# Patient Record
Sex: Male | Born: 1995 | Race: Black or African American | Hispanic: No | Marital: Single | Smoking: Never smoker
Health system: Southern US, Community
[De-identification: ages and names within clinical notes are randomized; demographics above are authoritative.]

## PROBLEM LIST (undated history)

## (undated) DIAGNOSIS — J45909 Unspecified asthma, uncomplicated: Secondary | ICD-10-CM

## (undated) HISTORY — PX: NO PAST SURGERIES: SHX2092

---
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
## 2018-01-08 ENCOUNTER — Other Ambulatory Visit: Payer: Self-pay

## 2018-01-08 ENCOUNTER — Ambulatory Visit
Admission: EM | Admit: 2018-01-08 | Discharge: 2018-01-08 | Disposition: A | Discharge status: Home / Self Care | Payer: Managed Care, Other (non HMO) | Attending: Physician Assistant | Admitting: Physician Assistant

## 2018-01-08 DIAGNOSIS — J029 Acute pharyngitis, unspecified: Secondary | ICD-10-CM

## 2018-01-08 DIAGNOSIS — Z113 Encounter for screening for infections with a predominantly sexual mode of transmission: Secondary | ICD-10-CM | POA: Diagnosis not present

## 2018-01-08 DIAGNOSIS — R590 Localized enlarged lymph nodes: Secondary | ICD-10-CM | POA: Diagnosis not present

## 2018-01-08 LAB — RAPID STREP SCREEN (MED CTR MEBANE ONLY): Streptococcus, Group A Screen (Direct): NEGATIVE

## 2018-01-08 LAB — MONONUCLEOSIS SCREEN: MONO SCREEN: NEGATIVE

## 2018-01-08 LAB — CHLAMYDIA/NGC RT PCR (ARMC ONLY)
CHLAMYDIA TR: NOT DETECTED
N GONORRHOEAE: NOT DETECTED

## 2018-01-08 NOTE — ED Triage Notes (Signed)
Patient c/o sore throat, HA, abdominal pain and nausea x 2 days. Patient states he noticed his left lymph nodes in his neck were swollen two weeks ago and now the right side is swollen.

## 2018-01-08 NOTE — ED Provider Notes (Signed)
[facility]    CSN: [id number] Arrival date & time: 01/08/18  1415     History   Chief Complaint Chief Complaint  Patient presents with  . Sore Throat    HPI Scott Rose is a 22 y.o. male.   Patient is a 22 year old male who presents with complaint of sore throat and bilateral lymph node pain.  Patient states his left lymph node is been sore for 1-1/2-2 weeks in his right lymph node started hurting about 2 days ago.  Patient also reports upset stomach 2 days ago.  Reports little bit of a cough and occasional chills.  Patient denies any ear symptoms, no fever, no body aches.  Patient does report his little brother at home is sick off and on and has had a school made that has been sick recently with pneumonia.  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  Patient states he is taking medication for headache.  Patient also requesting STD checks.  States he has a Engineer, materialselectronic cigarette that he likes other share and wants to make sure he does not have any transmittal diseases from that.      History reviewed. No pertinent past medical history.  There are no active problems to display for this patient.   History reviewed. No pertinent surgical history.     Home Medications    Prior to Admission medications   Not on File    Family History History reviewed. No pertinent family history.  Social History Social History   Tobacco Use  . Smoking status: Never Smoker  . Smokeless tobacco: Never Used  Substance Use Topics  . Alcohol use: No    Frequency: Never  . Drug use: No     Allergies   Patient has no known allergies.   Review of Systems Review of Systems  As noted above in HPI.  Other systems reviewed and found to be negative   Physical Exam Triage Vital Signs ED Triage Vitals  Enc Vitals Group     BP 01/08/18 1437 125/67     Pulse Rate 01/08/18 1437 68     Resp --      Temp 01/08/18 1437 98.1 F (36.7 C)     Temp Source 01/08/18  1437 Oral     SpO2 01/08/18 1437 100 %     Weight 01/08/18 1436 165 lb (74.8 kg)     Height 01/08/18 1436 5\' 11"  (1.803 m)     Head Circumference --      Peak Flow --      Pain Score 01/08/18 1436 6     Pain Loc --      Pain Edu? --      Excl. in GC? --    No data found.  Updated Vital Signs BP 125/67 (BP Location: Left Arm)   Pulse 68   Temp 98.1 F (36.7 C) (Oral)   Ht 5\' 11"  (1.803 m)   Wt 165 lb (74.8 kg)   SpO2 100%   BMI 23.01 kg/m    Physical Exam  Constitutional: He is oriented to person, place, and time. He appears well-developed and well-nourished. He does not appear ill.  HENT:  Head: Normocephalic and atraumatic.  Right Ear: Tympanic membrane and ear canal normal.  Left Ear: Tympanic membrane and ear canal normal.  Mouth/Throat: Uvula is midline and mucous membranes are normal. Posterior oropharyngeal erythema (with splotchy strawberry colorations) present. Tonsils are 1+ on the right. Tonsils are 1+ on the  left. No tonsillar exudate.  Eyes: Pupils are equal, round, and reactive to light.  Neck: Normal range of motion. Neck supple.  Cardiovascular: Normal rate, regular rhythm and normal heart sounds.  No murmur heard. Pulmonary/Chest: Effort normal and breath sounds normal. No respiratory distress. He has no wheezes.  Abdominal: Soft. Bowel sounds are normal. He exhibits no distension. There is no tenderness. There is no guarding.  Lymphadenopathy:    He has cervical adenopathy (bilateral).  Neurological: He is alert and oriented to person, place, and time.  Skin: Skin is warm and dry.  Psychiatric: He has a normal mood and affect. His behavior is normal.     UC Treatments / Results  Labs (all labs ordered are listed, but only abnormal results are displayed) Labs Reviewed  RAPID STREP SCREEN (NOT AT [facility])  CHLAMYDIA/NGC RT PCR ([facility] ONLY)  CULTURE, GROUP A STREP [facility])  MONONUCLEOSIS SCREEN  HIV ANTIBODY (ROUTINE TESTING)  HSV(HERPES  SIMPLEX VRS) I + II AB-IGG  RPR    EKG  EKG Interpretation None       Radiology No results found.  Procedures Procedures (including critical care time)  Medications Ordered in UC Medications - No data to display   Initial Impression / Assessment and Plan / UC Course  I have reviewed the triage vital signs and the nursing notes.  Pertinent labs & imaging results that were available during my care of the patient were reviewed by me and considered in my medical decision making (see chart for details).    Patient was complaining of sore throat as well as lymph node swelling.  Patient reports left-sided lymph node swelling times about 1/2-2 weeks in the right node being swollen for about 2 days.  Patient also with complaint of sore throat.  Patient with splotchy strawberry coloring to the throat so we will get a swab.  Patient also requesting STD check.  EBV would be a differential for the cervical adenopathy given its duration.  Also patient reported sharing his e-cigarette with others makes EBV a more possible cause for his lymphadenopathy.    Final Clinical Impressions(s) / UC Diagnoses   Final diagnoses:  Sore throat  Cervical adenopathy  Screen for STD (sexually transmitted disease)    ED Discharge Orders    None     Patient rapid strep negative.  We will give him precautions regarding sharing his E cigarettes in the current flu climate.  Advised him to treat his symptoms as needed due to likely viral infection.  Recommend ibuprofen or Tylenol as needed for pain and fever.  Have patient follow-up with his primary care provider.  Will contact him as the results of his tests come back.  Controlled Substance Prescriptions  Controlled Substance Registry consulted? Not Applicable   Candis Schatz, PA-C 01/08/18 1610

## 2018-01-08 NOTE — Discharge Instructions (Signed)
-  Strep test negative -can use ibuprofen or Tylenol for pain -sore throat and lymph nodes likely related to a viral infection which is treated with symptom management and fluids -The influenza virus is very prevalent, use caution in sharing e-cigarette with others -will contact you regarding your other tests -follow up with your PCP as needed

## 2018-01-11 LAB — CULTURE, GROUP A STREP (THRC)

## 2018-01-11 LAB — HIV ANTIBODY (ROUTINE TESTING W REFLEX): HIV SCREEN 4TH GENERATION: NONREACTIVE

## 2018-01-11 LAB — RPR: RPR Ser Ql: NONREACTIVE

## 2018-01-11 LAB — HSV(HERPES SIMPLEX VRS) I + II AB-IGG
HSV 1 Glycoprotein G Ab, IgG: 32.3 index — ABNORMAL HIGH (ref 0.00–0.90)
HSV 2 Glycoprotein G Ab, IgG: <0.91 index (ref 0.00–0.90)

## 2018-05-26 ENCOUNTER — Other Ambulatory Visit: Payer: Self-pay

## 2018-05-26 ENCOUNTER — Ambulatory Visit
Admission: EM | Admit: 2018-05-26 | Discharge: 2018-05-26 | Disposition: A | Discharge status: Home / Self Care | Payer: Managed Care, Other (non HMO) | Attending: Family Medicine | Admitting: Family Medicine

## 2018-05-26 ENCOUNTER — Encounter: Payer: Self-pay | Admitting: Emergency Medicine

## 2018-05-26 DIAGNOSIS — Z113 Encounter for screening for infections with a predominantly sexual mode of transmission: Secondary | ICD-10-CM

## 2018-05-26 DIAGNOSIS — Z202 Contact with and (suspected) exposure to infections with a predominantly sexual mode of transmission: Secondary | ICD-10-CM

## 2018-05-26 LAB — CHLAMYDIA/NGC RT PCR (ARMC ONLY)
Chlamydia Tr: NOT DETECTED
N gonorrhoeae: NOT DETECTED

## 2018-05-26 MED ORDER — TINIDAZOLE 500 MG PO TABS: 2.0000 g | ORAL_TABLET | Freq: Once | ORAL | 0 refills | Status: AC

## 2018-05-26 NOTE — ED Triage Notes (Signed)
Patient in today stating that his ex-partner told him that they were positive for trichomonas. He is here today to be tested. He denies any symptoms such as penile discharge or pain.

## 2018-05-26 NOTE — ED Provider Notes (Signed)
[facility]    CSN: [id number] Arrival date & time: 05/26/18  1800   History   Chief Complaint Chief Complaint  Patient presents with  . Exposure to STD    HPI  22 year old male presents with exposure to trichomonas.  Patient states that he was recently informed that his former partner tested positive for trichomonas.  He desires testing and treatment today.  He states that he is currently symptomatic.  No penile discharge.  No dysuria.  Patient is feeling well.  He has no other complaints or concerns at this time.  Social History Social History   Tobacco Use  . Smoking status: Never Smoker  . Smokeless tobacco: Never Used  Substance Use Topics  . Alcohol use: No    Frequency: Never  . Drug use: No    Allergies   Patient has no known allergies.   Review of Systems Review of Systems  Constitutional: Negative.   Genitourinary:       STD exposure.   Physical Exam Triage Vital Signs ED Triage Vitals  Enc Vitals Group     BP 05/26/18 1812 140/80     Pulse Rate 05/26/18 1812 73     Resp 05/26/18 1812 16     Temp 05/26/18 1812 98.5 F (36.9 C)     Temp Source 05/26/18 1812 Oral     SpO2 05/26/18 1812 100 %     Weight 05/26/18 1811 178 lb 12.8 oz (81.1 kg)     Height 05/26/18 1811 5\' 10"  (1.778 m)     Head Circumference --      Peak Flow --      Pain Score 05/26/18 1811 0     Pain Loc --      Pain Edu? --    Updated Vital Signs BP 140/80 (BP Location: Left Arm)   Pulse 73   Temp 98.5 F (36.9 C) (Oral)   Resp 16   Ht 5\' 10"  (1.778 m)   Wt 178 lb 12.8 oz (81.1 kg)   SpO2 100%   BMI 25.66 kg/m    Physical Exam  Constitutional: He is oriented to person, place, and time. He appears well-developed. No distress.  HENT:  Head: Normocephalic and atraumatic.  Eyes: Conjunctivae are normal.  Cardiovascular: Normal rate and regular rhythm.  Pulmonary/Chest: Effort normal and breath sounds normal.  Neurological: He is alert and oriented to  person, place, and time.  Psychiatric: He has a normal mood and affect. His behavior is normal.  Nursing note and vitals reviewed.   UC Treatments / Results  Labs (all labs ordered are listed, but only abnormal results are displayed) Labs Reviewed  CHLAMYDIA/NGC RT PCR ([facility] ONLY)  MISC [facility] TEST (SEND OUT)    EKG None  Radiology No results found.  Procedures Procedures (including critical care time)  Medications Ordered in UC Medications - No data to display  Initial Impression / Assessment and Plan / UC Course  I have reviewed the triage vital signs and the nursing notes.  Pertinent labs & imaging results that were available during my care of the patient were reviewed by me and considered in my medical decision making (see chart for details).    22 year old male presents with exposure to trichomonas.  Sending GC and chlamydia.  Sending test for trichomonas.  Treating empirically with tinidazole.   Final Clinical Impressions(s) / UC Diagnoses   Final diagnoses:  STD exposure     Discharge Instructions     Medication  as prescribed.  We will call with the results.  Take care  Dr. Adriana Simas     ED Prescriptions    Medication Sig Dispense Auth. Provider   tinidazole (TINDAMAX) 500 MG tablet Take 4 tablets (2,000 mg total) by mouth once for 1 dose. 4 tablet Tommie Sams, DO     Controlled Substance Prescriptions [place] Controlled Substance Registry consulted? Not Applicable   Tommie Sams, DO 05/26/18 Paulo Fruit

## 2018-05-26 NOTE — Discharge Instructions (Signed)
Medication as prescribed.  We will call with the results.  Take care  Dr. Kaityln Kallstrom  

## 2018-05-30 LAB — MISC LABCORP TEST (SEND OUT): LABCORP TEST CODE: 188052

## 2018-06-22 ENCOUNTER — Ambulatory Visit
Admission: EM | Admit: 2018-06-22 | Discharge: 2018-06-22 | Disposition: A | Discharge status: Home / Self Care | Payer: Managed Care, Other (non HMO) | Attending: Emergency Medicine | Admitting: Emergency Medicine

## 2018-06-22 ENCOUNTER — Other Ambulatory Visit: Payer: Self-pay

## 2018-06-22 DIAGNOSIS — Z113 Encounter for screening for infections with a predominantly sexual mode of transmission: Secondary | ICD-10-CM | POA: Diagnosis not present

## 2018-06-22 DIAGNOSIS — Z202 Contact with and (suspected) exposure to infections with a predominantly sexual mode of transmission: Secondary | ICD-10-CM | POA: Diagnosis not present

## 2018-06-22 LAB — CHLAMYDIA/NGC RT PCR (ARMC ONLY)
CHLAMYDIA TR: NOT DETECTED
N gonorrhoeae: NOT DETECTED

## 2018-06-22 MED ORDER — METRONIDAZOLE 500 MG PO TABS: 2000.0000 mg | ORAL_TABLET | Freq: Once | ORAL | 0 refills | Status: AC

## 2018-06-22 NOTE — ED Triage Notes (Signed)
Pt reports he was here on 05/26/18 and had STD testing. His results came back negative. He was prescribed medication but didn't pick any up. He wasn't sure if he was to start on meds. No sx but wondering if he should be taking meds and if he should get a new prescription.

## 2018-06-22 NOTE — Discharge Instructions (Addendum)
We are sending off repeat labs for gonorrhea, chlamydia, trichomonas.  I am sending you home with a wait-and-see prescription of Flagyl for you to fill.  Call here in several days to get your test results.  Fill the Flagyl if your trichomonas comes back positive and take all 4 pills at once.  This should treat it.  We will also treat you appropriately if your gonorrhea and/or chlamydia come back positive.  Your partner will require testing and treatment as well your labs come back positive.  Here is a list of primary care providers who are taking new patients:  Dr. Elizabeth Sauereanna Jones, Dr. Schuyler AmorWilliam Plonk [address] [place] [address] [phone]  [facility] [place] [address]  [place] [address]  [phone]  [facility] [address]  [place], [place] [address] [phone]  [facility] [place] [address]  [phone] [place], [place] [address]  Here are clinics/ other resources who will see you if you do not have insurance. Some have certain criteria that you must meet. Call them and find out what they are:  [facility]: [address] [place] [address] [place], [place] [address] Phone: [phone] Hours: First and Third Saturdays of each Month, 9 a.m. - 1 p.m.  [facility]: [address] [place], [place], [place] [address] Phone: [phone] Hours: Tuesday, 4 p.m. - 8 p.m. Thursday, 1 p.m. - 8 p.m. Wednesday, 9 a.m. - [facility]  [facility] [address], [place], [place] [address] Phone: [phone] Pharmacy Phone Number: [phone] Dental Phone Number: [phone] [facility] Insurance Help: [phone]  Dental Hours: Monday - Thursday, 8 a.m. - 6 p.m.  [facility] [address] [place], [place] [address] Phone: [phone] Pharmacy Phone Number: [phone] [facility] Insurance Help: [phone]  [facility] [address] [place], [place] [address] Phone:  [phone] Pharmacy Phone Number: [phone] [facility] Insurance Help: [phone]  [facility] [address] [place], [place] [address] Phone: [phone] [facility] Insurance Help: [phone]   [facility] [address] [place], [place] [address] Phone: [phone]  Go to www.goodrx.com to look up your medications. This will give you a list of where you can find your prescriptions at the most affordable prices. Or ask the pharmacist what the cash price is, or if they have any other discount programs available to help make your medication more affordable. This can be less expensive than what you would pay with insurance.

## 2018-06-22 NOTE — ED Provider Notes (Signed)
HPI  SUBJECTIVE:  Scott Rose is a 22 y.o. male who presents for STD testing.  Patient states that his current partner was diagnosed with trichomoniasis last month.  He is not sure if she was tested for other STDs.  He states that she was "treated for a bunch of things" but is not sure exactly what she was treated for. Patient was seen here last month for trichomonas exposure from his current sexual partner.  Rx'd tinidazole, which he never filled.   Gonorrhea, chlamydia, trichomonas sent and were negative.  he is asymptomatic today.  Denies urinary complaints, penile rash, penile discharge, testicular pain.  He states that he has not been with anybody else since his last visit. However, he is concerned and would like to have repeat testing for gonorrhea, chlamydia, trichomonas.   History reviewed. No pertinent past medical history.  History reviewed. No pertinent surgical history.  Family History  Problem Relation Age of Onset  . Healthy Mother   . Healthy Father     Social History   Tobacco Use  . Smoking status: Never Smoker  . Smokeless tobacco: Never Used  Substance Use Topics  . Alcohol use: Yes    Frequency: Never    Comment: social  . Drug use: No    No current facility-administered medications for this encounter.   Current Outpatient Medications:  .  metroNIDAZOLE (FLAGYL) 500 MG tablet, Take 4 tablets (2,000 mg total) by mouth once for 1 dose., Disp: 4 tablet, Rfl: 0  No Known Allergies   ROS  As noted in HPI.   Physical Exam  BP 129/71 (BP Location: Left Arm)   Pulse 64   Temp 98.3 F (36.8 C) (Oral)   Resp 18   Ht 5\' 10"  (1.778 m)   Wt 178 lb (80.7 kg)   SpO2 100%   BMI 25.54 kg/m   Constitutional: Well developed, well nourished, no acute distress Eyes:  EOMI, conjunctiva normal bilaterally HENT: Normocephalic, atraumatic,mucus membranes moist Respiratory: Normal inspiratory effort Cardiovascular: Normal rate GI: nondistended skin: No rash,  skin intact Musculoskeletal: no deformities Neurologic: Alert & oriented x 3, no focal neuro deficits Psychiatric: Speech and behavior appropriate   ED Course   Medications - No data to display  Orders Placed This Encounter  Procedures  . Chlamydia/NGC rt PCR    Standing Status:   Standing    Number of Occurrences:   1    Order Specific Question:   Patient immune status    Answer:   Normal  . Miscellaneous [facility] test (send-out)    Standing Status:   Standing    Number of Occurrences:   1    Order Specific Question:   Test name / description:    Answer:   male trichamonas NAAT    No results found for this or any previous visit (from the past 24 hour(s)). No results found.  ED Clinical Impression  Screening for STD (sexually transmitted disease)   ED Assessment/Plan  Previous labs, records reviewed as noted in HPI.  Patient declined testing for HIV and syphilis.  Will send off gonorrhea, chlamydia, trichomonas.  We will give him a wait-and-see prescription of Flagyl 2 g p.o. x1 if his trichomonas comes back positive.  Advised him to call here in several days to get his test results.  He is to fill the Flagyl if his trichomonas comes back positive.  We will treat accordingly depending on his other labs.  Advised him that his partner  will also need testing and treatment if any of his labs come back positive.  He will follow-up with a primary care physician of his choice, giving primary care referral list.  Discussed labs,  MDM, treatment plan, and plan for follow-up with patient.patient agrees with plan.   Meds ordered this encounter  Medications  . metroNIDAZOLE (FLAGYL) 500 MG tablet    Sig: Take 4 tablets (2,000 mg total) by mouth once for 1 dose.    Dispense:  4 tablet    Refill:  0    *This clinic note was created using Scientist, clinical (histocompatibility and immunogenetics). Therefore, there may be occasional mistakes despite careful proofreading.   ?   Domenick Gong, MD 06/22/18  2017

## 2018-06-24 LAB — MISC LABCORP TEST (SEND OUT): Labcorp test code: 188052

## 2018-08-10 ENCOUNTER — Other Ambulatory Visit: Payer: Self-pay

## 2018-08-10 ENCOUNTER — Encounter: Payer: Self-pay | Admitting: Emergency Medicine

## 2018-08-10 ENCOUNTER — Ambulatory Visit
Admission: EM | Admit: 2018-08-10 | Discharge: 2018-08-10 | Disposition: A | Discharge status: Home / Self Care | Payer: Managed Care, Other (non HMO) | Attending: Internal Medicine | Admitting: Internal Medicine

## 2018-08-10 DIAGNOSIS — J020 Streptococcal pharyngitis: Secondary | ICD-10-CM | POA: Diagnosis not present

## 2018-08-10 HISTORY — DX: Unspecified asthma, uncomplicated: J45.909

## 2018-08-10 LAB — RAPID STREP SCREEN (MED CTR MEBANE ONLY): Streptococcus, Group A Screen (Direct): POSITIVE — AB

## 2018-08-10 MED ORDER — PENICILLIN V POTASSIUM 500 MG PO TABS: 500.0000 mg | ORAL_TABLET | Freq: Two times a day (BID) | ORAL | 0 refills | Status: AC

## 2018-08-10 NOTE — Discharge Instructions (Signed)
Make sure to finish all the antibiotic. OK to take Dayquil for your cold

## 2018-08-10 NOTE — ED Provider Notes (Signed)
[facility]    CSN: [id number] Arrival date & time: 08/10/18  1629     History   Chief Complaint ST  HPI Scott Rose is a 22 y.o. male.   ST 2 days ago and gradually got worse and got a little nauseous. Today the ST is not  as bad. Has also been having URI x 3 days. Yesterday had chills. No body aches. Has a HA yesterday. No Gi symptoms.      Past Medical History:  Diagnosis Date  . Asthma     There are no active problems to display for this patient.   History reviewed. No pertinent surgical history.     Home Medications    Prior to Admission medications   Medication Sig Start Date End Date Taking? Authorizing Provider  albuterol (PROVENTIL HFA;VENTOLIN HFA) 108 (90 Base) MCG/ACT inhaler Inhale into the lungs every 6 (six) hours as needed for wheezing or shortness of breath.   Yes [provider]  penicillin v potassium (VEETID) 500 MG tablet Take 1 tablet (500 mg total) by mouth 2 (two) times daily with breakfast and lunch for 10 days. 08/10/18 08/20/18  Rodriguez-Southworth, Nettie Elm, PA-C    Family History Family History  Problem Relation Age of Onset  . Healthy Mother   . Healthy Father     Social History Social History   Tobacco Use  . Smoking status: Never Smoker  . Smokeless tobacco: Never Used  Substance Use Topics  . Alcohol use: Yes    Frequency: Never    Comment: social  . Drug use: No     Allergies   Patient has no known allergies.   Review of Systems Review of Systems  Constitutional: Positive for chills.  HENT: Positive for mouth sores, postnasal drip, rhinorrhea, sore throat and trouble swallowing. Negative for ear pain.   Respiratory: Positive for cough. Negative for chest tightness and shortness of breath.   Cardiovascular: Negative for chest pain.  Gastrointestinal: Negative for diarrhea, nausea and vomiting.  Musculoskeletal: Negative for arthralgias and joint swelling.  Skin: Negative for rash.    Allergic/Immunologic: Negative for immunocompromised state.       Has not noticed swollen glands  Neurological: Negative for dizziness.     Physical Exam Triage Vital Signs ED Triage Vitals [08/10/18 1640]  Enc Vitals Group     BP 140/71     Pulse Rate 73     Resp 18     Temp 98.2 F (36.8 C)     Temp Source Oral     SpO2 99 %     Weight 175 lb (79.4 kg)     Height 5\' 10"  (1.778 m)     Head Circumference      Peak Flow      Pain Score 3     Pain Loc      Pain Edu?      Excl. in GC?    No data found.  Updated Vital Signs BP 140/71 (BP Location: Left Arm)   Pulse 73   Temp 98.2 F (36.8 C) (Oral)   Resp 18   Ht 5\' 10"  (1.778 m)   Wt 175 lb (79.4 kg)   SpO2 99%   BMI 25.11 kg/m   Visual Acuity Right Eye Distance:   Left Eye Distance:   Bilateral Distance:    Right Eye Near:   Left Eye Near:    Bilateral Near:     Physical Exam   UC Treatments /  Results  Labs (all labs ordered are listed, but only abnormal results are displayed) Labs Reviewed  RAPID STREP SCREEN ([facility] ONLY) - Abnormal; Notable for the following components:      Result Value   Streptococcus, Group A Screen (Direct) POSITIVE (*)    All other components within normal limits  + rapid strep  EKG None  Radiology No results found.  Procedures none  Medications Ordered in UC Medications - No data to display  Initial Impression / Assessment and Plan / UC Course  I have reviewed the triage vital signs and the nursing notes.  Pertinent labs & imaging results that were available during my care of the patient were reviewed by me and considered in my medical decision making (see chart for details). He was placed on Penicillin 500 mg bid x 10 days and encourage to finish all of it.  Not contagious via air, but is via saliva.  Work note given to him. He checked with his boss about going back to work tomorrow and boss was OK with that.    Final Clinical Impressions(s) / UC  Diagnoses   Final diagnoses:  None     Discharge Instructions     Make sure to finish all the antibiotic. OK to take Dayquil for your cold    ED Prescriptions    Medication Sig Dispense Auth. Provider   penicillin v potassium (VEETID) 500 MG tablet Take 1 tablet (500 mg total) by mouth 2 (two) times daily with breakfast and lunch for 10 days. 40 tablet Rodriguez-Southworth, Nettie Elm, PA-C     Controlled Substance Prescriptions [place] Controlled Substance Registry consulted? NA   Garey Ham, [place] 08/10/18 1959

## 2018-08-10 NOTE — ED Triage Notes (Signed)
Patient c/o sore throat, headache, cough and nasal drainage that started on Sunday. Patient denies fever. Patient has taken OTC Theraflu with no relief.

## 2018-10-19 ENCOUNTER — Other Ambulatory Visit: Payer: Self-pay

## 2018-10-19 ENCOUNTER — Encounter: Payer: Self-pay | Admitting: Emergency Medicine

## 2018-10-19 ENCOUNTER — Ambulatory Visit
Admission: EM | Admit: 2018-10-19 | Discharge: 2018-10-19 | Disposition: A | Discharge status: Home / Self Care | Payer: Managed Care, Other (non HMO) | Attending: Family Medicine | Admitting: Family Medicine

## 2018-10-19 DIAGNOSIS — Z113 Encounter for screening for infections with a predominantly sexual mode of transmission: Secondary | ICD-10-CM

## 2018-10-19 LAB — CHLAMYDIA/NGC RT PCR (ARMC ONLY)
CHLAMYDIA TR: NOT DETECTED
N gonorrhoeae: NOT DETECTED

## 2018-10-19 NOTE — ED Provider Notes (Signed)
[facility]    CSN: [id number] Arrival date & time: 10/19/18  1502     History   Chief Complaint Chief Complaint  Patient presents with  . Exposure to STD    HPI Scott Rose is a 22 y.o. male.   22 yo male with a c/o wanting to get checked for STDs, specifically chlamydia, gonorrhea and trichomonas. States he's had chlamydia in the past and had a partner earlier this year who was diagnosed with trichomonas. Patient denies having any symptoms currently. Denies any penile discharge, dysuria, hematuria, rash, fevers, chills. Declines HIV and RPR testing.    The history is provided by the patient.  Exposure to STD     Past Medical History:  Diagnosis Date  . Asthma     There are no active problems to display for this patient.   Past Surgical History:  Procedure Laterality Date  . NO PAST SURGERIES         Home Medications    Prior to Admission medications   Medication Sig Start Date End Date Taking? Authorizing Provider  albuterol (PROVENTIL HFA;VENTOLIN HFA) 108 (90 Base) MCG/ACT inhaler Inhale into the lungs every 6 (six) hours as needed for wheezing or shortness of breath.   Yes [provider]    Family History Family History  Problem Relation Age of Onset  . Healthy Mother   . Healthy Father     Social History Social History   Tobacco Use  . Smoking status: Never Smoker  . Smokeless tobacco: Never Used  Substance Use Topics  . Alcohol use: Yes    Frequency: Never    Comment: social  . Drug use: No     Allergies   Patient has no known allergies.   Review of Systems Review of Systems   Physical Exam Triage Vital Signs ED Triage Vitals  Enc Vitals Group     BP 10/19/18 1513 119/69     Pulse Rate 10/19/18 1513 (!) 58     Resp 10/19/18 1513 16     Temp 10/19/18 1513 98.5 F (36.9 C)     Temp Source 10/19/18 1513 Oral     SpO2 10/19/18 1513 100 %     Weight 10/19/18 1514 172 lb (78 kg)     Height 10/19/18  1514 5\' 9"  (1.753 m)     Head Circumference --      Peak Flow --      Pain Score 10/19/18 1513 0     Pain Loc --      Pain Edu? --      Excl. in GC? --    No data found.  Updated Vital Signs BP 119/69 (BP Location: Left Arm)   Pulse (!) 58   Temp 98.5 F (36.9 C) (Oral)   Resp 16   Ht 5\' 9"  (1.753 m)   Wt 78 kg   SpO2 100%   BMI 25.40 kg/m   Visual Acuity Right Eye Distance:   Left Eye Distance:   Bilateral Distance:    Right Eye Near:   Left Eye Near:    Bilateral Near:     Physical Exam  Constitutional: He appears well-developed and well-nourished. No distress.  Skin: He is not diaphoretic.  Nursing note and vitals reviewed.    UC Treatments / Results  Labs (all labs ordered are listed, but only abnormal results are displayed) Labs Reviewed  CHLAMYDIA/NGC RT PCR ([facility] ONLY)  MISC [facility] TEST (SEND OUT)  EKG None  Radiology No results found.  Procedures Procedures (including critical care time)  Medications Ordered in UC Medications - No data to display  Initial Impression / Assessment and Plan / UC Course  I have reviewed the triage vital signs and the nursing notes.  Pertinent labs & imaging results that were available during my care of the patient were reviewed by me and considered in my medical decision making (see chart for details).      Final Clinical Impressions(s) / UC Diagnoses   Final diagnoses:  Screen for STD (sexually transmitted disease)    ED Prescriptions    None     1. diagnosis reviewed with patient  2. Check labs  3. Further recommendations pending test results  Controlled Substance Prescriptions  Controlled Substance Registry consulted? Not Applicable   Payton Mccallum, MD 10/19/18 [id number]

## 2018-10-19 NOTE — ED Triage Notes (Signed)
Patient in today for STD testing. Patient denies exposure to STD since his last visit and states he hasn't had unprotected intercourse. Patient denies penile discharge or pain with urination.

## 2018-10-21 LAB — MISC LABCORP TEST (SEND OUT): LABCORP TEST CODE: 188052

## 2019-02-13 ENCOUNTER — Encounter: Payer: Self-pay | Admitting: Emergency Medicine

## 2019-02-13 ENCOUNTER — Other Ambulatory Visit: Payer: Self-pay

## 2019-02-13 ENCOUNTER — Ambulatory Visit
Admission: EM | Admit: 2019-02-13 | Discharge: 2019-02-13 | Disposition: A | Discharge status: Home / Self Care | Payer: Managed Care, Other (non HMO) | Attending: Family Medicine | Admitting: Family Medicine

## 2019-02-13 DIAGNOSIS — Z202 Contact with and (suspected) exposure to infections with a predominantly sexual mode of transmission: Secondary | ICD-10-CM | POA: Diagnosis not present

## 2019-02-13 DIAGNOSIS — R369 Urethral discharge, unspecified: Secondary | ICD-10-CM | POA: Diagnosis not present

## 2019-02-13 DIAGNOSIS — Z113 Encounter for screening for infections with a predominantly sexual mode of transmission: Secondary | ICD-10-CM | POA: Diagnosis not present

## 2019-02-13 LAB — URINALYSIS, COMPLETE (UACMP) WITH MICROSCOPIC
Bilirubin Urine: NEGATIVE
Glucose, UA: NEGATIVE mg/dL
Hgb urine dipstick: NEGATIVE
KETONES UR: 15 mg/dL — AB
Nitrite: NEGATIVE
PROTEIN: 30 mg/dL — AB
pH: 6 (ref 5.0–8.0)

## 2019-02-13 LAB — CHLAMYDIA/NGC RT PCR (ARMC ONLY)
CHLAMYDIA TR: NOT DETECTED
N gonorrhoeae: DETECTED — AB

## 2019-02-13 MED ORDER — AZITHROMYCIN 500 MG PO TABS
1000.0000 mg | ORAL_TABLET | Freq: Once | ORAL | Status: AC
  Administered 2019-02-13: 1000 mg via ORAL

## 2019-02-13 MED ORDER — CEFTRIAXONE SODIUM 250 MG IJ SOLR
250.0000 mg | Freq: Once | INTRAMUSCULAR | Status: AC
  Administered 2019-02-13: 250 mg via INTRAMUSCULAR

## 2019-02-13 MED ORDER — METRONIDAZOLE 500 MG PO TABS: ORAL_TABLET | ORAL | 0 refills | Status: AC

## 2019-02-13 NOTE — ED Provider Notes (Signed)
[facility]    CSN: [id number] Arrival date & time: 02/13/19  1718     History   Chief Complaint Chief Complaint  Patient presents with  . Exposure to STD    HPI [place] L Nish is a 23 y.o. male.   23 yo male with a 2-3 day h/o white discharge. States he's had unprotected intercourse and has been exposed to trichomonas in the past. Denies any fevers, chills, pain, swelling.   The history is provided by the patient.  Exposure to STD     Past Medical History:  Diagnosis Date  . Asthma     There are no active problems to display for this patient.   Past Surgical History:  Procedure Laterality Date  . NO PAST SURGERIES         Home Medications    Prior to Admission medications   Medication Sig Start Date End Date Taking? Authorizing Provider  albuterol (PROVENTIL HFA;VENTOLIN HFA) 108 (90 Base) MCG/ACT inhaler Inhale into the lungs every 6 (six) hours as needed for wheezing or shortness of breath.    [provider]  metroNIDAZOLE (FLAGYL) 500 MG tablet 4 tabs po once 02/13/19   Payton Mccallum, MD    Family History Family History  Problem Relation Age of Onset  . Healthy Mother   . Healthy Father     Social History Social History   Tobacco Use  . Smoking status: Never Smoker  . Smokeless tobacco: Never Used  Substance Use Topics  . Alcohol use: Yes    Frequency: Never    Comment: social  . Drug use: Yes    Types: Marijuana     Allergies   Patient has no known allergies.   Review of Systems Review of Systems   Physical Exam Triage Vital Signs ED Triage Vitals  Enc Vitals Group     BP 02/13/19 1742 (!) 143/71     Pulse Rate 02/13/19 1742 61     Resp 02/13/19 1742 18     Temp 02/13/19 1742 98.4 F (36.9 C)     Temp Source 02/13/19 1742 Oral     SpO2 02/13/19 1742 100 %     Weight 02/13/19 1739 180 lb (81.6 kg)     Height 02/13/19 1739 5\' 10"  (1.778 m)     Head Circumference --      Peak Flow --      Pain Score  02/13/19 1739 0     Pain Loc --      Pain Edu? --      Excl. in GC? --    No data found.  Updated Vital Signs BP (!) 143/71 (BP Location: Left Arm)   Pulse 61   Temp 98.4 F (36.9 C) (Oral)   Resp 18   Ht 5\' 10"  (1.778 m)   Wt 81.6 kg   SpO2 100%   BMI 25.83 kg/m   Visual Acuity Right Eye Distance:   Left Eye Distance:   Bilateral Distance:    Right Eye Near:   Left Eye Near:    Bilateral Near:     Physical Exam Vitals signs and nursing note reviewed.  Constitutional:      General: He is not in acute distress.    Appearance: He is not toxic-appearing or diaphoretic.  Genitourinary:    Penis: Discharge present.   Neurological:     Mental Status: He is alert.      UC Treatments / Results  Labs (all labs  ordered are listed, but only abnormal results are displayed) Labs Reviewed  URINALYSIS, COMPLETE (UACMP) WITH MICROSCOPIC - Abnormal; Notable for the following components:      Result Value   Color, Urine AMBER (*)    APPearance HAZY (*)    Specific Gravity, Urine >1.030 (*)    Ketones, ur 15 (*)    Protein, ur 30 (*)    Leukocytes,Ua TRACE (*)    Bacteria, UA MANY (*)    All other components within normal limits  CHLAMYDIA/NGC RT PCR ([facility] ONLY)  TRICHOMONAS VAGINALIS, PROBE AMP  URINE CULTURE    EKG None  Radiology No results found.  Procedures Procedures (including critical care time)  Medications Ordered in UC Medications  azithromycin (ZITHROMAX) tablet 1,000 mg (1,000 mg Oral Given 02/13/19 1833)  cefTRIAXone (ROCEPHIN) injection 250 mg (250 mg Intramuscular Given 02/13/19 1833)    Initial Impression / Assessment and Plan / UC Course  I have reviewed the triage vital signs and the nursing notes.  Pertinent labs & imaging results that were available during my care of the patient were reviewed by me and considered in my medical decision making (see chart for details).      Final Clinical Impressions(s) / UC Diagnoses    Final diagnoses:  Penile discharge  Screen for STD (sexually transmitted disease)  STD exposure   Discharge Instructions   None    ED Prescriptions    Medication Sig Dispense Auth. Provider   metroNIDAZOLE (FLAGYL) 500 MG tablet 4 tabs po once 4 tablet Payton Mccallum, MD      1. Lab results and possible diagnosis reviewed with patient 2. Empiric tx with rocephin 250mg  IM x1 and zithromax 2gm given 3. rx as per orders above; reviewed possible side effects, interactions, risks and benefits  3. Await other tests  4. Follow-up prn if symptoms worsen or don't improve Controlled Substance Prescriptions [place] Controlled Substance Registry consulted? Not Applicable   Payton Mccallum, MD 02/13/19 1950

## 2019-02-13 NOTE — ED Triage Notes (Signed)
Pt c/o dysuria and white/yellow penile discharge. Started 2-3 days ago. He has has recent unprotected sex but was told in the past that he had been exposed to trichomonas.

## 2019-02-15 LAB — URINE CULTURE
Culture: NO GROWTH
Special Requests: NORMAL

## 2019-02-16 ENCOUNTER — Telehealth (HOSPITAL_COMMUNITY): Payer: Self-pay | Admitting: Emergency Medicine

## 2019-02-16 LAB — TRICHOMONAS VAGINALIS, PROBE AMP: Trich vag by NAA: NEGATIVE

## 2019-02-16 NOTE — Telephone Encounter (Signed)
Test for gonorrhea was positive. This was treated at the urgent care visit with IM rocephin 250mg and po zithromax 1g. Pt needs education to refrain from sexual intercourse for 7 days after treatment to give the medicine time to work. Sexual partners need to be notified and tested/treated. Condoms may reduce risk of reinfection. Recheck or followup with PCP for further evaluation if symptoms are not improving. [facility] notified.   Attempted to reach patient. No answer at this time. Voicemail left.     

## 2019-02-17 ENCOUNTER — Telehealth (HOSPITAL_COMMUNITY): Payer: Self-pay | Admitting: Emergency Medicine

## 2019-02-17 NOTE — Telephone Encounter (Signed)
Attempted to reach patient x2. No answer at this time. Voicemail left.    

## 2019-02-20 ENCOUNTER — Telehealth (HOSPITAL_COMMUNITY): Payer: Self-pay | Admitting: Emergency Medicine

## 2019-02-20 NOTE — Telephone Encounter (Signed)
Attempted to reach patient x3. No answer at this time. Voicemail left. Letter sent    

## 2021-01-29 ENCOUNTER — Ambulatory Visit
Admission: EM | Admit: 2021-01-29 | Discharge: 2021-01-29 | Discharge status: Against Medical Advice | Payer: BC Managed Care – PPO | Attending: Sports Medicine | Admitting: Sports Medicine

## 2021-01-29 ENCOUNTER — Other Ambulatory Visit: Payer: Self-pay

## 2021-06-06 ENCOUNTER — Emergency Department: Payer: BC Managed Care – PPO

## 2021-06-06 ENCOUNTER — Other Ambulatory Visit: Payer: Self-pay

## 2021-06-06 ENCOUNTER — Emergency Department
Admission: EM | Admit: 2021-06-06 | Discharge: 2021-06-06 | Disposition: A | Discharge status: Home / Self Care | Payer: BC Managed Care – PPO | Attending: Student in an Organized Health Care Education/Training Program | Admitting: Student in an Organized Health Care Education/Training Program

## 2021-06-06 DIAGNOSIS — Y92410 Unspecified street and highway as the place of occurrence of the external cause: Secondary | ICD-10-CM | POA: Diagnosis not present

## 2021-06-06 DIAGNOSIS — Z87891 Personal history of nicotine dependence: Secondary | ICD-10-CM | POA: Insufficient documentation

## 2021-06-06 DIAGNOSIS — M542 Cervicalgia: Secondary | ICD-10-CM | POA: Insufficient documentation

## 2021-06-06 DIAGNOSIS — J45909 Unspecified asthma, uncomplicated: Secondary | ICD-10-CM | POA: Diagnosis not present

## 2021-06-06 DIAGNOSIS — M546 Pain in thoracic spine: Secondary | ICD-10-CM | POA: Diagnosis not present

## 2021-06-06 DIAGNOSIS — R519 Headache, unspecified: Secondary | ICD-10-CM | POA: Diagnosis not present

## 2021-06-06 MED ORDER — HYDROCODONE-ACETAMINOPHEN 5-325 MG PO TABS
1.0000 | ORAL_TABLET | Freq: Once | ORAL | Status: AC
Start: 2021-06-06 — End: 2021-06-06
  Administered 2021-06-06: 1 via ORAL
  Filled 2021-06-06: qty 1

## 2021-06-06 MED ORDER — METHOCARBAMOL 500 MG PO TABS: 500.0000 mg | ORAL_TABLET | Freq: Three times a day (TID) | ORAL | 0 refills | Status: AC | PRN

## 2021-06-06 MED ORDER — MELOXICAM 15 MG PO TABS
15.0000 mg | ORAL_TABLET | Freq: Every day | ORAL | 2 refills | Status: AC
Start: 2021-06-06 — End: 2021-06-13

## 2021-06-06 MED ORDER — ONDANSETRON 4 MG PO TBDP
4.0000 mg | ORAL_TABLET | Freq: Once | ORAL | Status: AC
  Administered 2021-06-06: 4 mg via ORAL
  Filled 2021-06-06: qty 1

## 2021-06-06 MED ORDER — KETOROLAC TROMETHAMINE 30 MG/ML IJ SOLN
30.0000 mg | Freq: Once | INTRAMUSCULAR | Status: AC
  Administered 2021-06-06: 30 mg via INTRAMUSCULAR
  Filled 2021-06-06: qty 1

## 2021-06-06 NOTE — Discharge Instructions (Addendum)
Take Meloxicam and Robaxin as directed.  
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
## 2022-03-02 ENCOUNTER — Ambulatory Visit
Admission: EM | Admit: 2022-03-02 | Discharge: 2022-03-02 | Disposition: A | Discharge status: Home / Self Care | Payer: BC Managed Care – PPO | Attending: Emergency Medicine | Admitting: Emergency Medicine

## 2022-03-02 DIAGNOSIS — Z113 Encounter for screening for infections with a predominantly sexual mode of transmission: Secondary | ICD-10-CM | POA: Insufficient documentation

## 2022-03-02 DIAGNOSIS — Z202 Contact with and (suspected) exposure to infections with a predominantly sexual mode of transmission: Secondary | ICD-10-CM | POA: Diagnosis present

## 2022-03-02 MED ORDER — DOXYCYCLINE HYCLATE 100 MG PO CAPS: 100.0000 mg | ORAL_CAPSULE | Freq: Two times a day (BID) | ORAL | 0 refills | Status: AC

## 2022-03-02 NOTE — ED Provider Notes (Signed)
?[facility] ? ? ? ?CSN: [id number] ?Arrival date & time: 03/02/22  1740 ? ? ?  ? ?History   ?Chief Complaint ?Chief Complaint  ?Patient presents with  ? STI Testing  ?  Asymptomatic  ? ? ?HPI ?Scott Rose is a 26 y.o. male.  ? ?Presents with request for STD testing after exposure to chlamydia.  Denies all symptoms.  Sexually active, 1 partner, no condom use. ? ?Past Medical History:  ?Diagnosis Date  ? Asthma   ? ? ?There are no problems to display for this patient. ? ? ?Past Surgical History:  ?Procedure Laterality Date  ? NO PAST SURGERIES    ? ? ? ? ? ?Home Medications   ? ?Prior to Admission medications   ?Medication Sig Start Date End Date Taking? Authorizing Provider  ?doxycycline (VIBRAMYCIN) 100 MG capsule Take 1 capsule (100 mg total) by mouth 2 (two) times daily. 03/02/22  Yes Tessa Seaberry, Elita Boone, NP  ?albuterol (PROVENTIL HFA;VENTOLIN HFA) 108 (90 Base) MCG/ACT inhaler Inhale into the lungs every 6 (six) hours as needed for wheezing or shortness of breath.    [provider]  ?metroNIDAZOLE (FLAGYL) 500 MG tablet 4 tabs po once 02/13/19   Payton Mccallum, MD  ? ? ?Family History ?Family History  ?Problem Relation Age of Onset  ? Healthy Mother   ? Healthy Father   ? ? ?Social History ?Social History  ? ?Tobacco Use  ? Smoking status: Never  ? Smokeless tobacco: Never  ?Vaping Use  ? Vaping Use: Some days  ? Last attempt to quit: 04/19/2018  ?Substance Use Topics  ? Alcohol use: Yes  ?  Comment: social  ? Drug use: Yes  ?  Types: Marijuana  ? ? ? ?Allergies   ?Patient has no known allergies. ? ? ?Review of Systems ?Review of Systems  ?Constitutional: Negative.   ?Genitourinary: Negative.   ?Musculoskeletal: Negative.   ?Skin: Negative.   ?Neurological: Negative.   ? ? ?Physical Exam ?Triage Vital Signs ?ED Triage Vitals  ?Enc Vitals Group  ?   BP 03/02/22 1751 98/63  ?   Pulse Rate 03/02/22 1751 74  ?   Resp 03/02/22 1751 18  ?   Temp 03/02/22 1751 98.1 ?F (36.7 ?C)  ?   Temp Source  03/02/22 1751 Oral  ?   SpO2 03/02/22 1751 99 %  ?   Weight 03/02/22 1750 180 lb (81.6 kg)  ?   Height 03/02/22 1750 5\' 10"  (1.778 m)  ?   Head Circumference --   ?   Peak Flow --   ?   Pain Score 03/02/22 1750 0  ?   Pain Loc --   ?   Pain Edu? --   ?   Excl. in GC? --   ? ?No data found. ? ?Updated Vital Signs ?BP 98/63 (BP Location: Left Arm)   Pulse 74   Temp 98.1 ?F (36.7 ?C) (Oral)   Resp 18   Ht 5\' 10"  (1.778 m)   Wt 180 lb (81.6 kg)   SpO2 99%   BMI 25.83 kg/m?  ? ?Visual Acuity ?Right Eye Distance:   ?Left Eye Distance:   ?Bilateral Distance:   ? ?Right Eye Near:   ?Left Eye Near:    ?Bilateral Near:    ? ?Physical Exam ?Constitutional:   ?   Appearance: Normal appearance.  ?HENT:  ?   Head: Normocephalic.  ?Eyes:  ?   Extraocular Movements: Extraocular movements intact.  ?  Pulmonary:  ?   Effort: Pulmonary effort is normal.  ?Genitourinary: ?   Comments: Deferred, self collect urethral swab ?Skin: ?   General: Skin is warm and dry.  ?Neurological:  ?   Mental Status: He is alert and oriented to person, place, and time. Mental status is at baseline.  ?Psychiatric:     ?   Mood and Affect: Mood normal.     ?   Behavior: Behavior normal.  ? ? ? ?UC Treatments / Results  ?Labs ?(all labs ordered are listed, but only abnormal results are displayed) ?Labs Reviewed  ?CYTOLOGY, (ORAL, ANAL, URETHRAL) ANCILLARY ONLY  ? ? ?EKG ? ? ?Radiology ?No results found. ? ?Procedures ?Procedures (including critical care time) ? ?Medications Ordered in UC ?Medications - No data to display ? ?Initial Impression / Assessment and Plan / UC Course  ?I have reviewed the triage vital signs and the nursing notes. ? ?Pertinent labs & imaging results that were available during my care of the patient were reviewed by me and considered in my medical decision making (see chart for details). ? ?Screening for STI ?Exposure to STD ? ?We will prophylactically treat for chlamydia, doxycycline 7-day course prescribed, STI labs pending,  advised abstinence until lab results, treatment is complete, advised condom use during all sexual encounters moving forward, may follow-up with urgent care as needed ?Final Clinical Impressions(s) / UC Diagnoses  ? ?Final diagnoses:  ?Routine screening for STI (sexually transmitted infection)  ?Exposure to STD  ? ? ? ?Discharge Instructions   ? ?  ?You are being prophylactically treated for chlamydia  ? ?Take doxycyline twice a day for 7 days  ? ?Labs pending 2-3 days, you will be contacted if positive for any sti and treatment will be sent to the pharmacy, you will have to return to the clinic if positive for gonorrhea to receive treatment  ? ?Please refrain from having sex until labs results, if positive please refrain from having sex until treatment complete and symptoms resolve  ? ?If positive for Chlamydia  gonorrhea or trichomoniasis please notify partner or partners so they may tested as well ? ?Moving forward, it is recommended you use some form of protection against the transmission of sti infections  such as condoms or dental dams with each sexual encounter  ? ? ? ?ED Prescriptions   ? ? Medication Sig Dispense Auth. Provider  ? doxycycline (VIBRAMYCIN) 100 MG capsule Take 1 capsule (100 mg total) by mouth 2 (two) times daily. 14 capsule Valinda Hoar, NP  ? ?  ? ?PDMP not reviewed this encounter. ?  ?Valinda Hoar, NP ?03/02/22 1851 ? ?

## 2022-03-02 NOTE — Discharge Instructions (Addendum)
You are being prophylactically treated for chlamydia  ? ?Take doxycyline twice a day for 7 days  ? ?Labs pending 2-3 days, you will be contacted if positive for any sti and treatment will be sent to the pharmacy, you will have to return to the clinic if positive for gonorrhea to receive treatment  ? ?Please refrain from having sex until labs results, if positive please refrain from having sex until treatment complete and symptoms resolve  ? ?If positive for Chlamydia  gonorrhea or trichomoniasis please notify partner or partners so they may tested as well ? ?Moving forward, it is recommended you use some form of protection against the transmission of sti infections  such as condoms or dental dams with each sexual encounter  ? ?

## 2022-03-02 NOTE — ED Triage Notes (Signed)
Patient is here for "STI Testing". Girlfriend just had labs done "and she has chlamydia". Currently asymptomatic. No dysuria. No penis discharge. No painful ejaculation. No redness/rash. No fever. No other concerns.  ?

## 2022-03-03 ENCOUNTER — Ambulatory Visit: Payer: Self-pay

## 2022-03-04 LAB — CYTOLOGY, (ORAL, ANAL, URETHRAL) ANCILLARY ONLY
Chlamydia: POSITIVE — AB
Comment: NEGATIVE
Comment: NEGATIVE
Comment: NORMAL
Neisseria Gonorrhea: NEGATIVE
Trichomonas: NEGATIVE

## 2022-03-09 ENCOUNTER — Ambulatory Visit: Payer: Self-pay

## 2022-03-11 ENCOUNTER — Ambulatory Visit: Payer: Self-pay

## 2022-11-08 IMAGING — CT CT HEAD W/O CM
4 series · 16 of 47 positions shown, 18 images · non-contrast
Comparison: None.

CLINICAL DATA: Head trauma, minor, normal mental status (Age
19-64y); Back trauma, no prior imaging (Age >= 16y); Neck trauma,
dangerous injury mechanism (Age 16-64y)

EXAM:
CT HEAD WITHOUT CONTRAST
CT CERVICAL SPINE WITHOUT CONTRAST
CT THORACIC SPINE WITHOUT CONTRAST
TECHNIQUE: Multidetector CT imaging of the head and cervical spine was
performed following the standard protocol without intravenous
contrast. Multiplanar CT image reconstructions of the cervical spine
were also generated.
Multidetector CT images of the thoracic were obtained using the
standard protocol without intravenous contrast.

[Series 2: head bone · axial · 0.45mm/px · z∈[+501,+531]mm · 3 of 75 slices shown]
[im 8/75  bone]
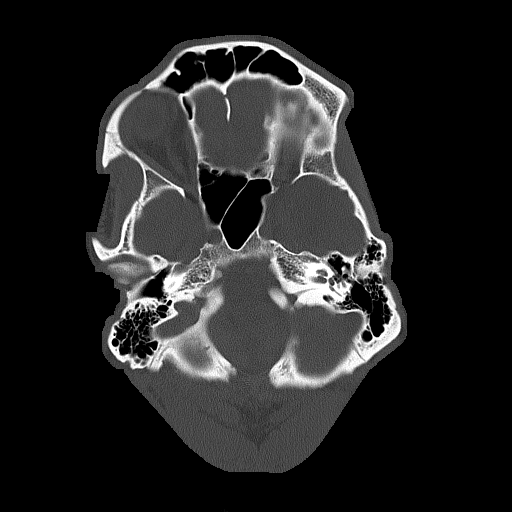
[im 15/75  bone]
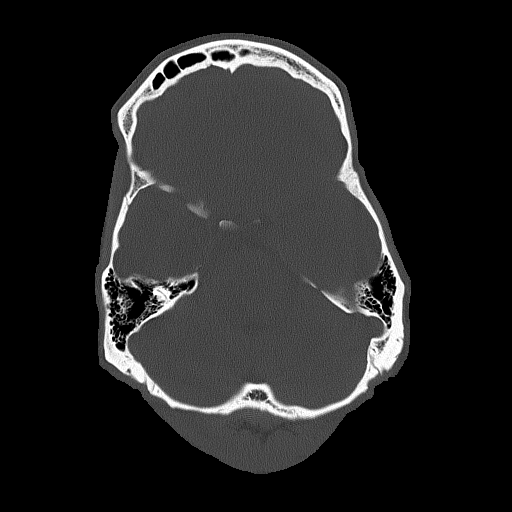
[im 23/75  bone]
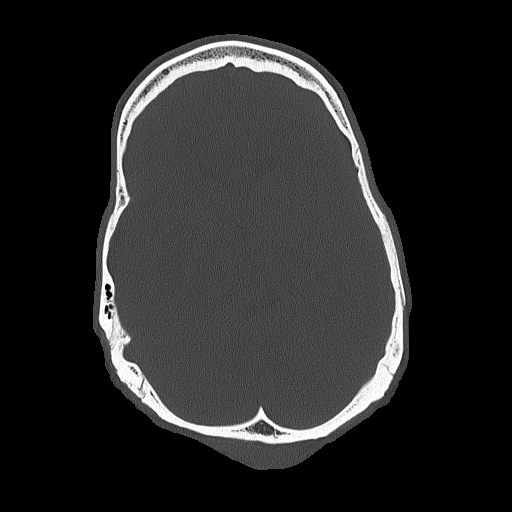

[Series 3: head wo · axial · 0.45mm/px · z∈[+502,+612]mm · 7 of 30 slices shown, 9 images]
[im 4/30  brain]
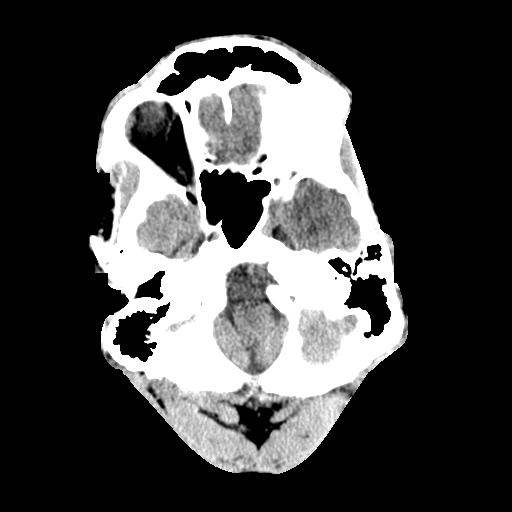
[im 4/30  bone]
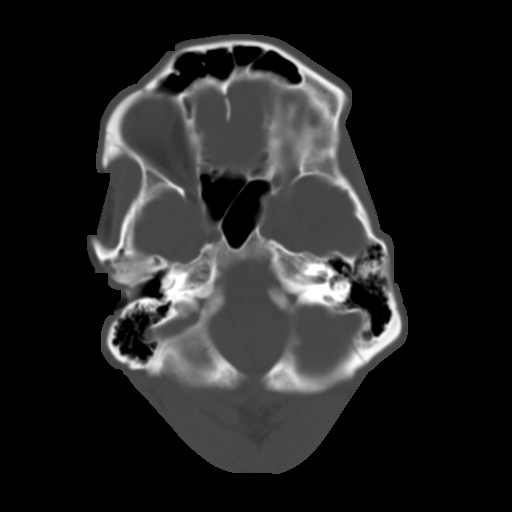
[im 8/30  brain]
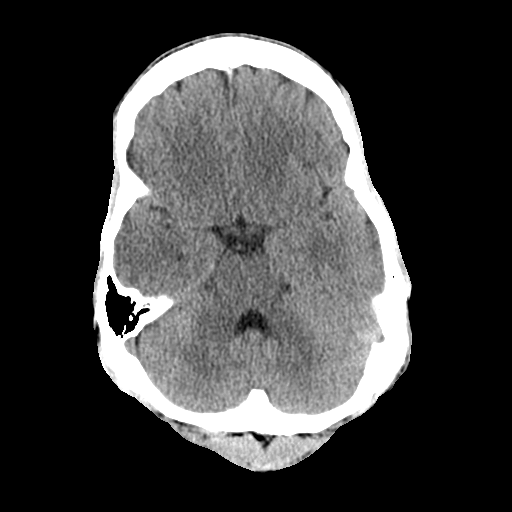
[im 11/30  brain]
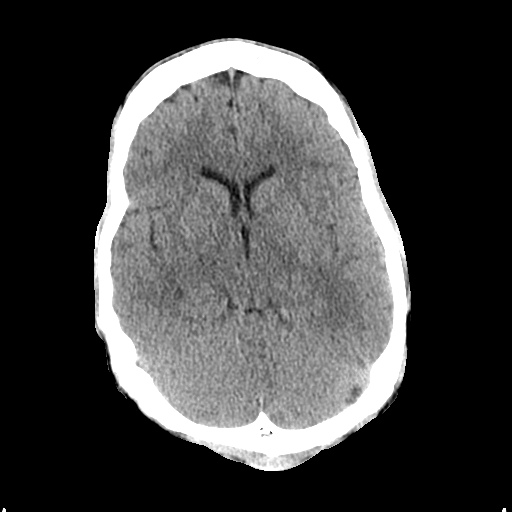
[im 15/30  brain]
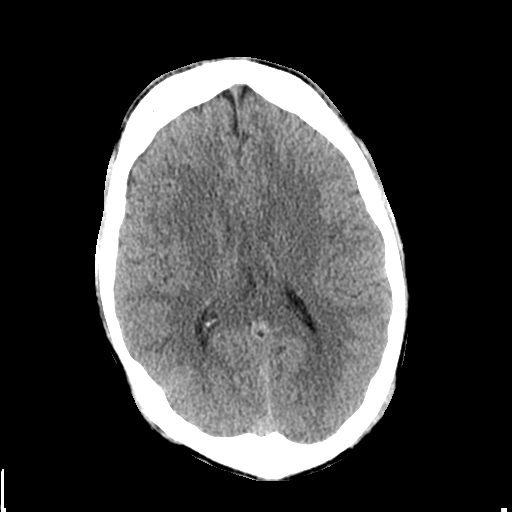
[im 19/30  brain]
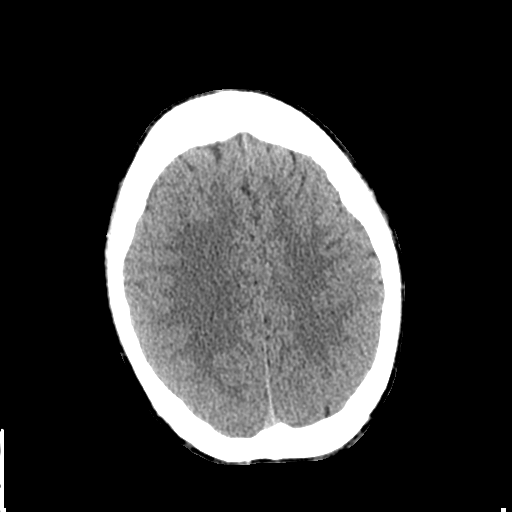
[im 19/30  bone]
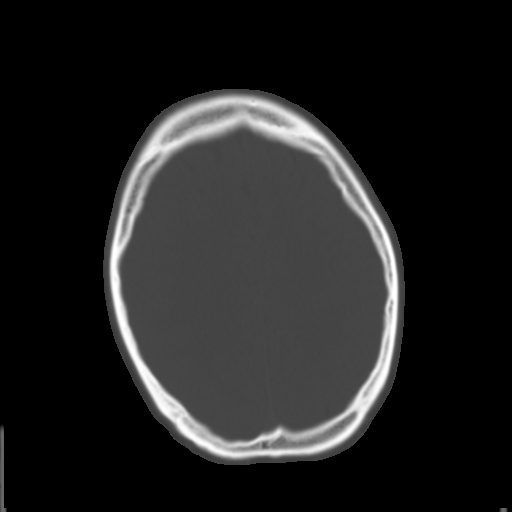
[im 22/30  brain]
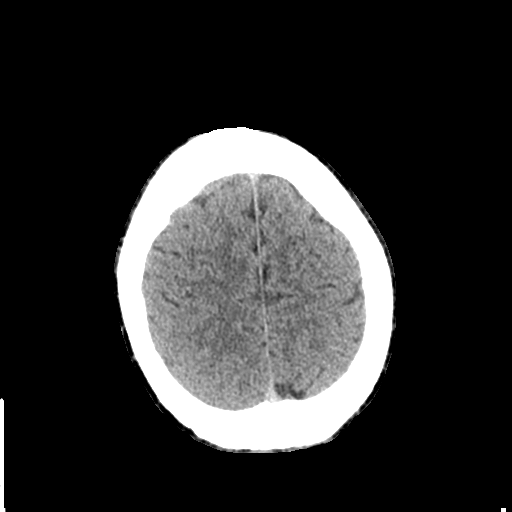
[im 26/30  brain]
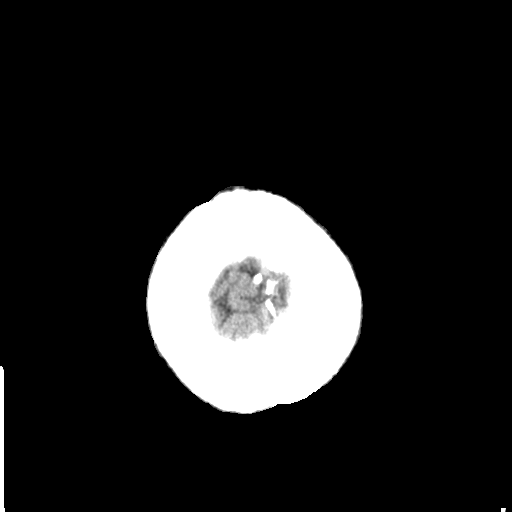

[Series 4: coronal soft tissue · coronal · 0.35mm/px · 3 of 69 slices shown]
[im 23/69  brain]
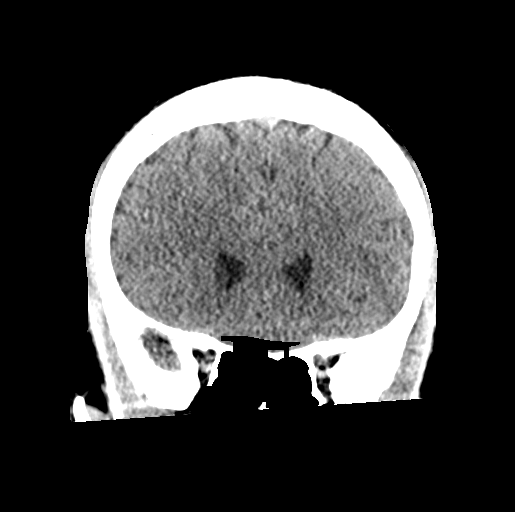
[im 31/69  brain]
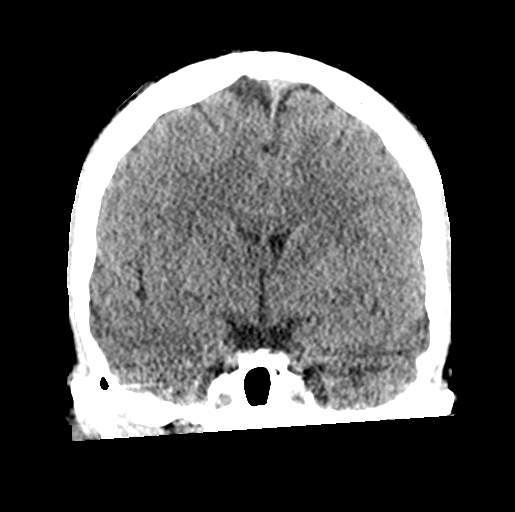
[im 38/69  brain]
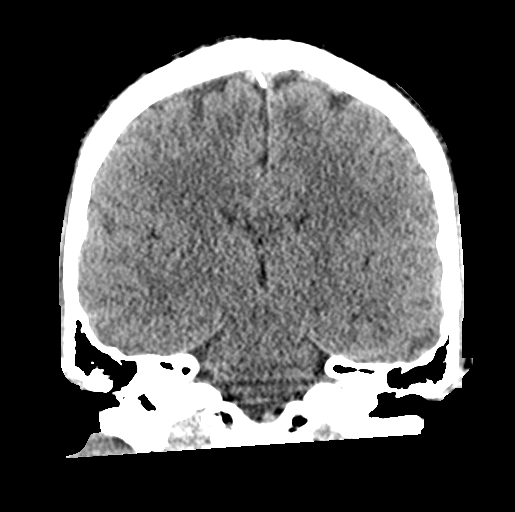

[Series 5: sagittal soft tissue · sagittal · 0.35mm/px · 3 of 53 slices shown]
[im 18/53  brain]
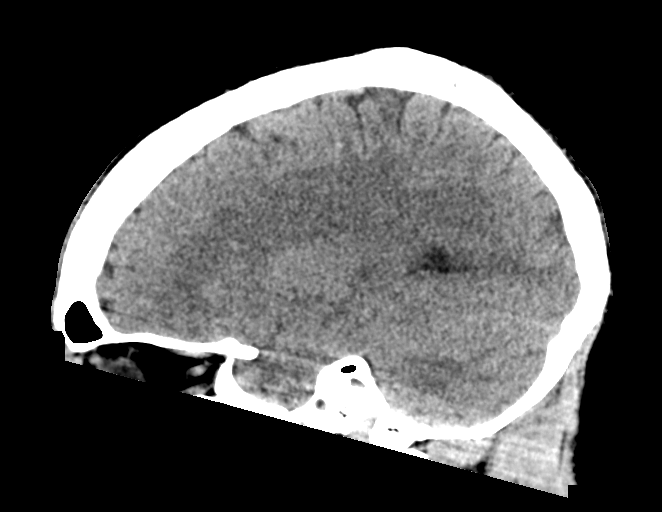
[im 27/53  brain]
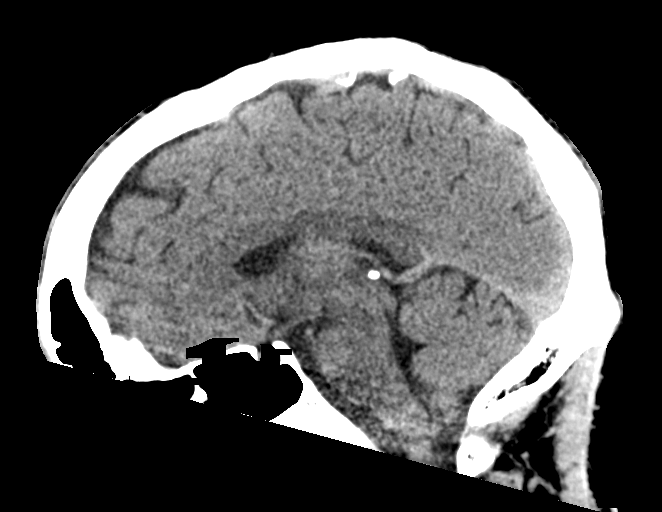
[im 35/53  brain]
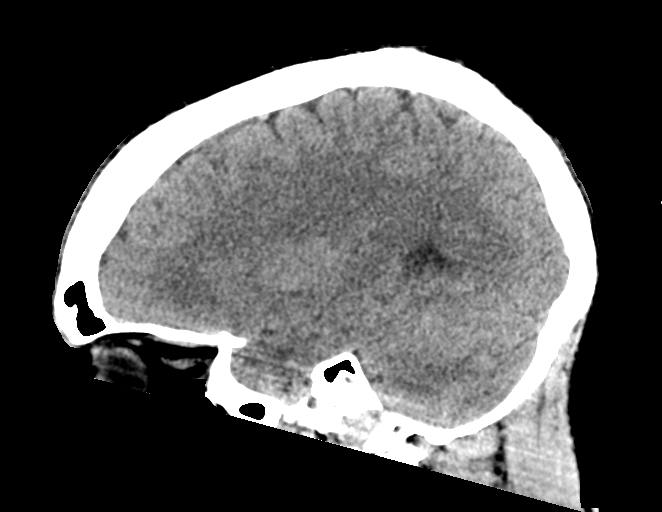

[16 of 47 positions shown; findings below may reference images not displayed]

FINDINGS: CT HEAD FINDINGS

Brain: No evidence of acute infarction, hemorrhage, hydrocephalus,
extra-axial collection or mass lesion/mass effect.

Vascular: No hyperdense vessel or unexpected calcification.

Skull: Normal. Negative for fracture or focal lesion.

Sinuses/Orbits: No acute finding.

Other: None.

CT CERVICAL SPINE FINDINGS

Alignment: Normal.

Skull base and vertebrae: No acute fracture. No primary bone lesion
or focal pathologic process.

Soft tissues and spinal canal: No prevertebral fluid or swelling. No
visible canal hematoma.

Disc levels:  No significant degenerative changes.

Other: None

CT THORACIC  SPINE FINDINGS

Vertebrae: No acute fracture. No primary bone lesion or focal
pathologic process.

Alignment: Normal.

Disc levels:  No significant degenerative changes.

Spinal canal: No prevertebral fluid or swelling. No visible canal
hematoma.

Soft tissues: Unremarkable.
IMPRESSION: 1.  No acute intracranial abnormality.
2.  No acute fracture or static subluxation of the cervical spine.
3.  No acute fracture or static subluxation of the thoracic spine.

## 2022-11-08 IMAGING — CT CT CERVICAL SPINE W/O CM
2 series · 9 of 27 positions shown, 11 images · non-contrast
Comparison: None.

CLINICAL DATA: Head trauma, minor, normal mental status (Age
19-64y); Back trauma, no prior imaging (Age >= 16y); Neck trauma,
dangerous injury mechanism (Age 16-64y)

EXAM:
CT HEAD WITHOUT CONTRAST
CT CERVICAL SPINE WITHOUT CONTRAST
CT THORACIC SPINE WITHOUT CONTRAST
TECHNIQUE: Multidetector CT imaging of the head and cervical spine was
performed following the standard protocol without intravenous
contrast. Multiplanar CT image reconstructions of the cervical spine
were also generated.
Multidetector CT images of the thoracic were obtained using the
standard protocol without intravenous contrast.

[Series 3: c spine soft · axial · 0.38mm/px · z∈[+369,+477]mm · 4 of 78 slices shown, 5 images]
[im 12/78  soft-tissue]
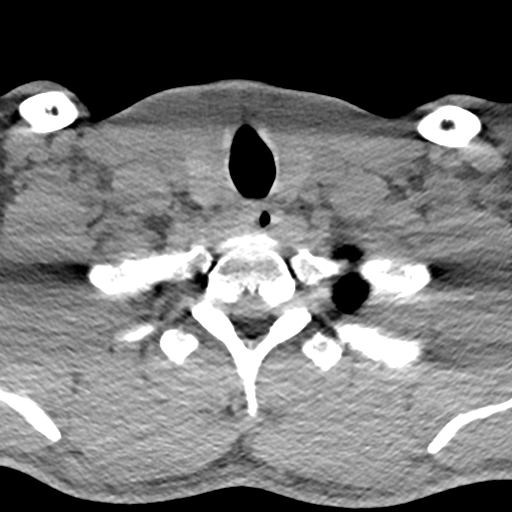
[im 12/78  bone]
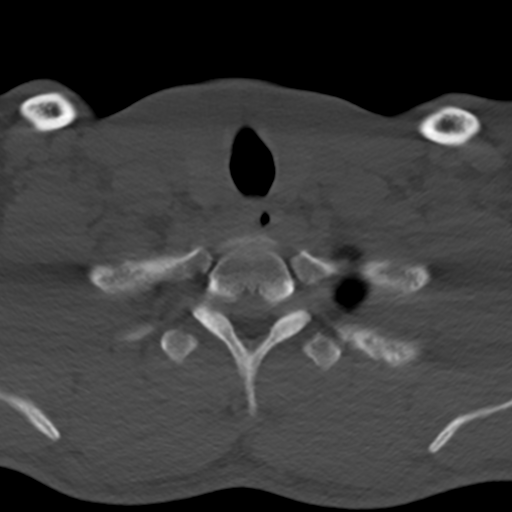
[im 30/78  bone]
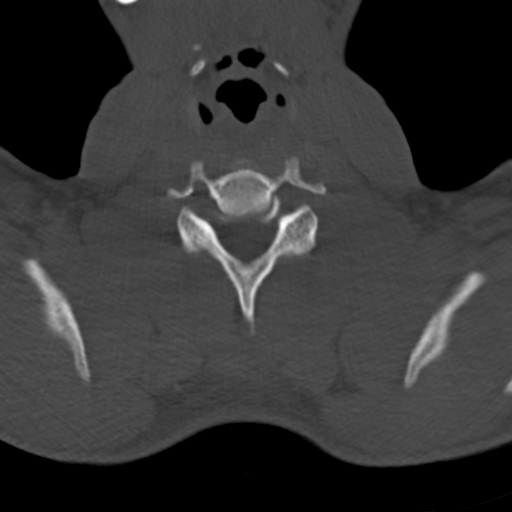
[im 48/78  bone]
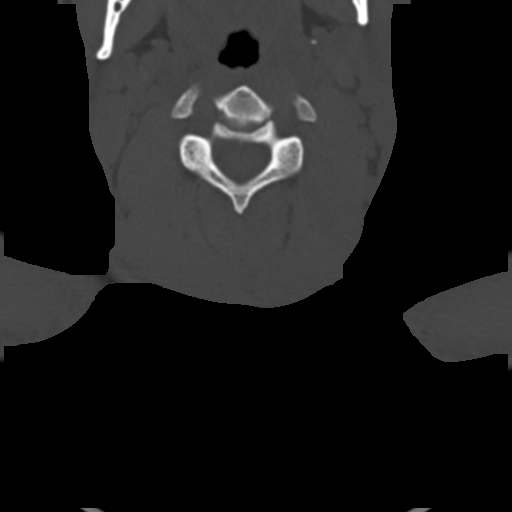
[im 66/78  bone]
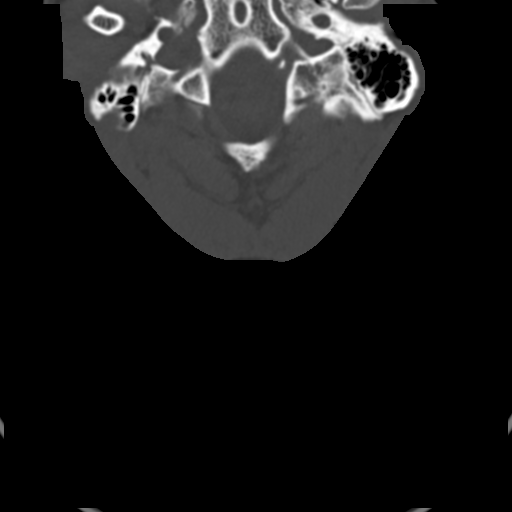

[Series 6: sagittal bone · sagittal · 0.22mm/px · 5 of 62 slices shown, 6 images]
[im 21/62  bone]
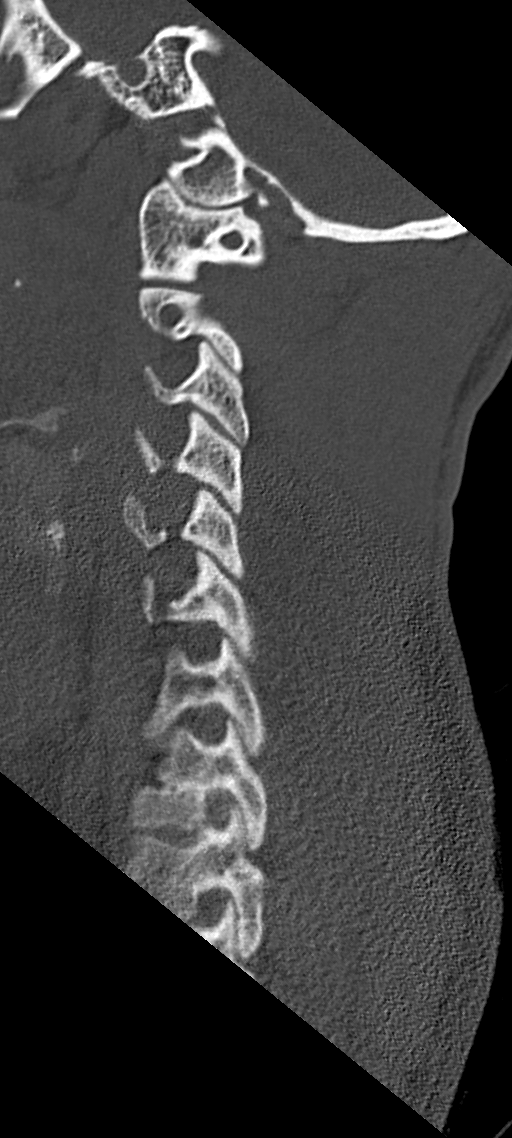
[im 26/62  bone]
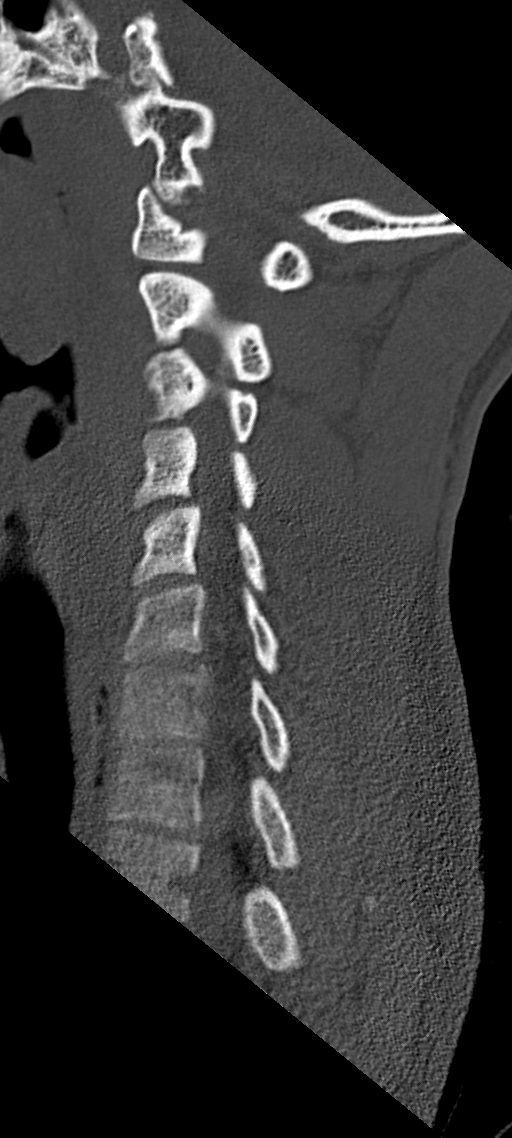
[im 31/62  soft-tissue]
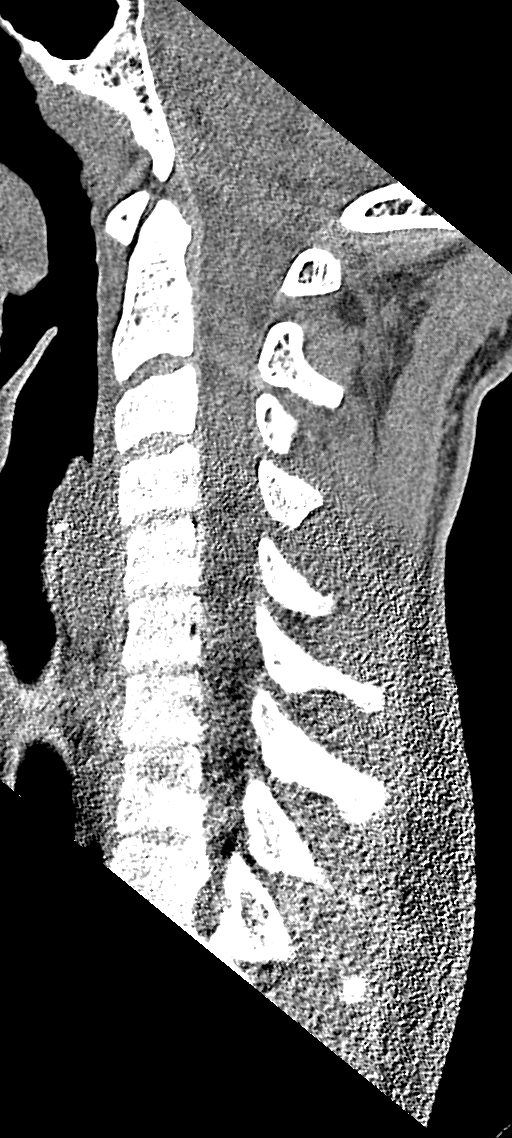
[im 31/62  bone]
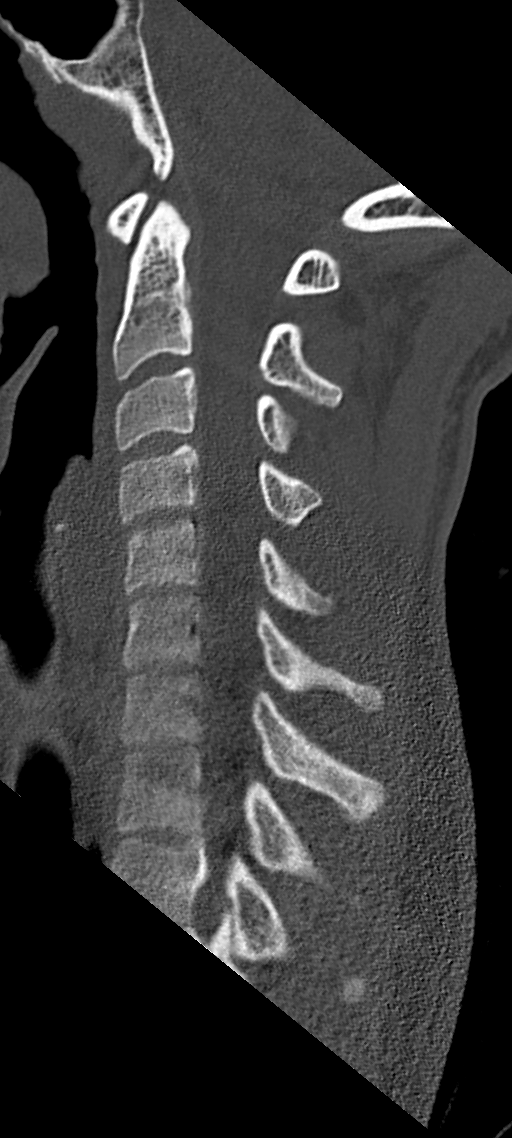
[im 36/62  bone]
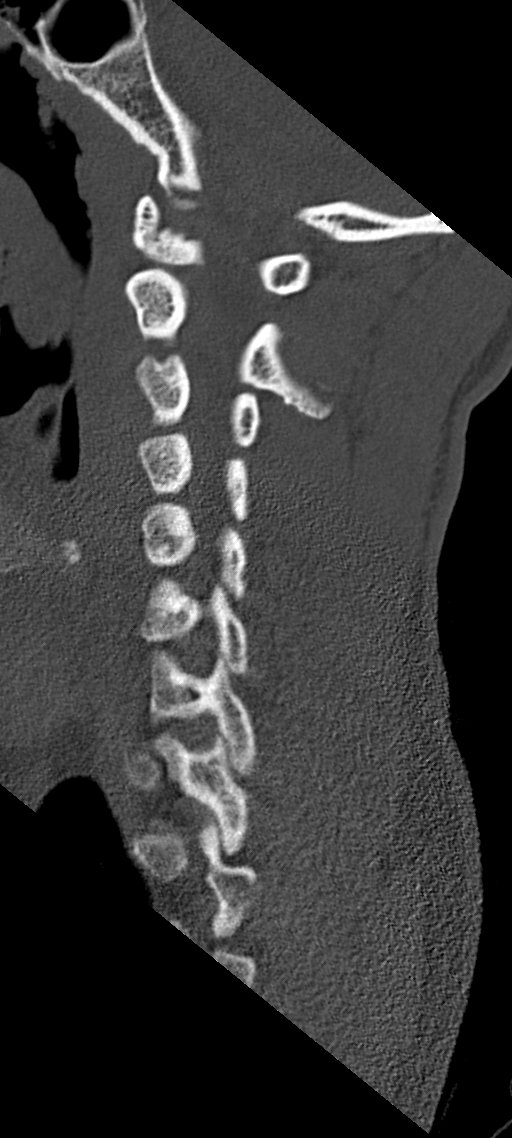
[im 41/62  bone]
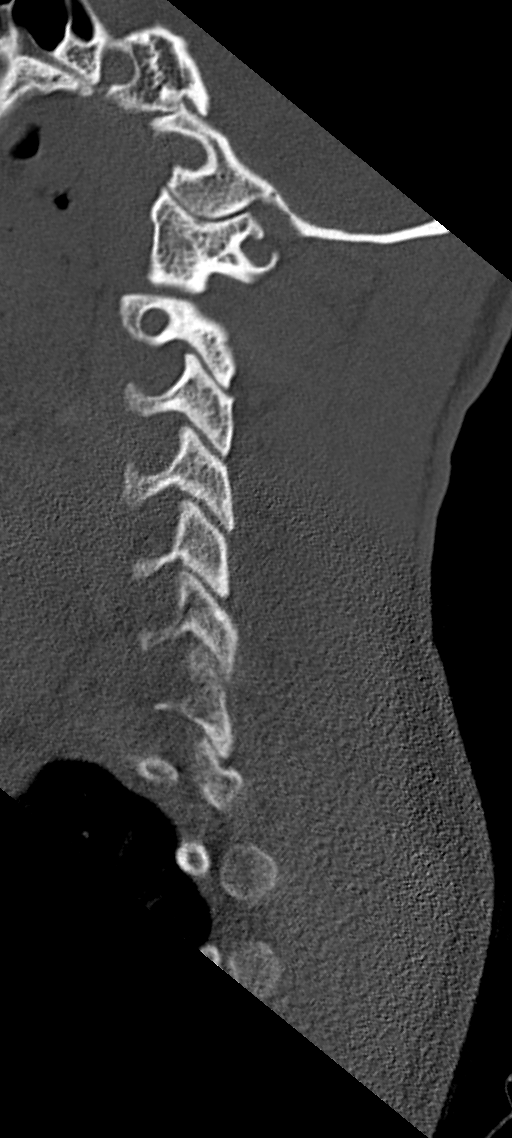

[9 of 27 positions shown; findings below may reference images not displayed]

FINDINGS: CT HEAD FINDINGS

Brain: No evidence of acute infarction, hemorrhage, hydrocephalus,
extra-axial collection or mass lesion/mass effect.

Vascular: No hyperdense vessel or unexpected calcification.

Skull: Normal. Negative for fracture or focal lesion.

Sinuses/Orbits: No acute finding.

Other: None.

CT CERVICAL SPINE FINDINGS

Alignment: Normal.

Skull base and vertebrae: No acute fracture. No primary bone lesion
or focal pathologic process.

Soft tissues and spinal canal: No prevertebral fluid or swelling. No
visible canal hematoma.

Disc levels:  No significant degenerative changes.

Other: None

CT THORACIC  SPINE FINDINGS

Vertebrae: No acute fracture. No primary bone lesion or focal
pathologic process.

Alignment: Normal.

Disc levels:  No significant degenerative changes.

Spinal canal: No prevertebral fluid or swelling. No visible canal
hematoma.

Soft tissues: Unremarkable.
IMPRESSION: 1.  No acute intracranial abnormality.
2.  No acute fracture or static subluxation of the cervical spine.
3.  No acute fracture or static subluxation of the thoracic spine.

## 2024-02-10 ENCOUNTER — Ambulatory Visit: Payer: Self-pay

## 2024-08-28 ENCOUNTER — Ambulatory Visit: Payer: Self-pay | Admitting: Family Medicine

## 2024-08-28 DIAGNOSIS — Z113 Encounter for screening for infections with a predominantly sexual mode of transmission: Secondary | ICD-10-CM

## 2024-08-28 DIAGNOSIS — A549 Gonococcal infection, unspecified: Secondary | ICD-10-CM | POA: Insufficient documentation

## 2024-08-28 DIAGNOSIS — R369 Urethral discharge, unspecified: Secondary | ICD-10-CM

## 2024-08-28 DIAGNOSIS — R4586 Emotional lability: Secondary | ICD-10-CM

## 2024-08-28 LAB — HM HIV SCREENING LAB: HM HIV Screening: NEGATIVE

## 2024-08-28 MED ORDER — CEFTRIAXONE SODIUM 500 MG IJ SOLR
500.0000 mg | Freq: Once | INTRAMUSCULAR | Status: AC
  Administered 2024-08-28: 500 mg via INTRAMUSCULAR

## 2024-08-28 NOTE — Progress Notes (Signed)
 [facility] Department STI clinic [address] [place] [address] Main phone: [phone]  STI screening visit  Subjective:  Scott Rose is a 28 y.o. male being seen today for an STI screening visit. The patient reports they do have symptoms.    Patient has the following medical conditions:  Patient Active Problem List   Diagnosis Date Noted   Gonorrhea 08/28/2024   Chief Complaint  Patient presents with   SEXUALLY TRANSMITTED DISEASE   HPI Patient reports desire for symptomatic STI testing. Does have one week duration of white cloudy penile discharge and dysuria. Thinks he has some cervical lymphadenopathy; although did have mild URI recently. No rashes, sores, fever, chills, lower abdominal pain.   Just stopped vaping about 4 days ago.   See flowsheet for further details and programmatic requirements  Hyperlink available at the top of the signed note in blue.  Flow sheet content below:  Pregnancy Intention Screening Does the patient want to become pregnant in the next year?: Yes Does the patient's partner want to become pregnant in the next year?: No Would the patient like to discuss contraceptive options today?: N/A All Patients Anyone smoke around pt and/or pt's children?: No Anyone smoke inside pt's house?: No Anyone smoke inside car?: No Anyone smoke inside the workplace?: No Reason For STD Screen STD Screening: Has symptoms Have you ever had an STD?: Yes History of Antibiotic use in the past 2 weeks?: No STD Symptoms Genital Itching: No Lower abdominal pain: No Discharge: Yes Dysuria: Yes Genital ulcer / lesion: No Rash: No Vaginal irritation: No Oral / Other skin ulcer: No Pain with sex: No Sore Throat: No Visual Changes: No Vaginal Bleeding: No Abuse History Does patient feel they have a problem with Anxiety?: Yes Does patient feel they have a problem with Depression?: Yes Referral to Behavioral Health:  Yes Counseling Medication side effects discussed with patient?: Yes Contact card(s) given to patient: Yes Patient counseled to abstain from sex for: 7 days Patient counseled to abstain from sex for?: 7 days post partner's treatment Patient counseled to use condoms with all sex: Condoms declined RTC in 2-3 weeks for test results: Yes Clinic will call if test results abnormal before test result appt.: Yes Test results given to patient Patient counseled to use condoms with all sex: Condoms declined STD Treatment Patient counseled to abstain from sex for: 7 days Patient counseled to abstain from sex for?: 7 days post partner's treatment  Screening for MPX risk:  Unexplained rash?  No   MSM?  No   Multiple or anonymous sex partners?  Yes   Any close or sexual contact with a person  diagnosed with MPX?  No   Any outside the [place]  where MPX is endemic?  No   High clinical suspicion for MPX?    -Unlikely to be chickenpox    -Lymphadenopathy    -Rash that presents in same phase of       evolution on any given body part  No   STI screening history: Last HIV test per patient/review of record was No results found for: HMHIVSCREEN  Lab Results  Component Value Date   HIV Non Reactive 01/08/2018   Last HEPC test per patient/review of record was No results found for: HMHEPCSCREEN No components found for: HEPC   Last HEPB test per patient/review of record was No components found for: HMHEPBSCREEN   Fertility: Does the patient or their partner desires a pregnancy in the  next year? No  Immunization History  Administered Date(s) Administered   Hepatitis B 1996/03/30, 09/27/1996, 02/28/1997   Hpv-Unspecified 08/07/2009, 06/10/2010, 08/14/2011   Tdap 07/01/2007    The following portions of the patient's history were reviewed and updated as appropriate: allergies, current medications, past medical history, past social history, past surgical history and problem list.  Objective:   There were no vitals filed for this visit.  Physical Exam Chaperone present: Patient politely declines chaperone.  Constitutional:      Appearance: Normal appearance.  HENT:     Head: Normocephalic and atraumatic.     Comments: No nits or hair loss    Mouth/Throat:     Mouth: Mucous membranes are moist. No oral lesions.     Pharynx: Oropharynx is clear. No oropharyngeal exudate or posterior oropharyngeal erythema.  Eyes:     General:        Right eye: No discharge.        Left eye: No discharge.     Conjunctiva/sclera:     Right eye: Right conjunctiva is not injected. No exudate.    Left eye: Left conjunctiva is not injected. No exudate. Pulmonary:     Effort: Pulmonary effort is normal.  Abdominal:     Palpations: There is no hepatomegaly.     Hernia: There is no hernia in the left inguinal area or right inguinal area.  Genitourinary:    Pubic Area: No rash or pubic lice (no nits).      Penis: Discharge (whiteish discharge) present. No tenderness, swelling or lesions.      Testes: Normal.        Right: Tenderness or swelling not present.        Left: Tenderness or swelling not present.     Epididymis:     Right: Normal. No mass or tenderness.     Left: Normal. No mass or tenderness.     Rectum: Normal. No tenderness (no lesions or discharge).     Comments: Penile Discharge Amount: moderate Color:  white Lymphadenopathy:     Head:     Right side of head: No preauricular or posterior auricular adenopathy.     Left side of head: No preauricular or posterior auricular adenopathy.     Cervical: No cervical adenopathy.     Upper Body:     Right upper body: No supraclavicular, axillary or epitrochlear adenopathy.     Left upper body: No supraclavicular, axillary or epitrochlear adenopathy.     Lower Body: No right inguinal adenopathy. Left inguinal adenopathy present.  Skin:    General: Skin is warm and dry.     Findings: No lesion or rash.  Neurological:     General: No  focal deficit present.     Mental Status: He is alert.  Psychiatric:        Mood and Affect: Mood normal.        Behavior: Behavior normal.    Assessment and Plan:  Scott L Un is a 28 y.o. male presenting to the [facility] Department for STI screening  Penile discharge -     Gram stain  Screening examination for venereal disease -     Chlamydia/GC NAA, Confirmation -     Gonococcus culture  Mood changes -     Ambulatory referral to Behavioral Health  Gonorrhea Assessment & Plan: Gonorrhea on gram stain. Rx ceftriaxone . Will await results of urine test for chlamydia.   Orders: -     cefTRIAXone  Sodium  Patient does have STI symptoms Patient accepted the following screenings: oral GC culture, penile gram stain for GC, and urine CT/GC Patient meets criteria for HepB screening? No. Ordered? not applicable Patient meets criteria for HepC screening? No. Ordered? not applicable Recommended condom use with all sex Discussed importance of condom use for STI prevention  Treat positive test results per standing order. Discussed time line for State Lab results and that patient will be called with positive results and encouraged patient to call if he had not heard in 2 weeks Recommended repeat testing in 3 months with positive results. Recommended returning for continued or worsening symptoms.   No follow-ups on file.  No future appointments.  Betsey CHRISTELLA Helling, MD

## 2024-08-28 NOTE — Assessment & Plan Note (Addendum)
 Gonorrhea on gram stain. Rx ceftriaxone . Will await results of urine test for chlamydia.

## 2024-08-28 NOTE — Progress Notes (Signed)
 Gram stain results reviewed with pt and treated per SO. IM rocephin  given in L deltoid and tolerated well. Larraine JONELLE Northern, RN

## 2024-08-28 NOTE — Patient Instructions (Signed)
 STI screening - Today we obtained a urine sample to screen for gonorrhea and chlamydia, penile swab to screen for gonorrhea, and oral swab to screen for gonorrhea - If the results are abnormal, I will give you a call.    Estimated time frame for results collected at the [facility] Department: Same day Trichomonas Yeast BV (bacterial vaginosis)  Within 3-5 days Gonorrhea Chlamydia  Within 1 week HIV Syphilis Hepatitis B Hepatitis C

## 2024-08-29 LAB — GRAM STAIN

## 2024-09-01 ENCOUNTER — Ambulatory Visit: Payer: Self-pay

## 2024-09-01 LAB — CHLAMYDIA/GC NAA, CONFIRMATION
Chlamydia trachomatis, NAA: NEGATIVE
Neisseria gonorrhoeae, NAA: POSITIVE — AB

## 2024-09-01 LAB — N. GONORRHOEAE NAA, CONFIRM: N. gonorrhoeae NAA, Confirm: POSITIVE — AB

## 2024-09-01 NOTE — Progress Notes (Signed)
 Message sent via MyChart to pt with results. Was treated at appointment on 08/28/24 with Ceftriaxone  500mg  IM injection.

## 2024-09-05 LAB — GONOCOCCUS CULTURE

## 2024-09-06 NOTE — Progress Notes (Signed)
 Pt read myChart message as of 09/06/24.

## 2024-09-08 ENCOUNTER — Telehealth: Payer: Self-pay

## 2024-09-08 ENCOUNTER — Telehealth: Payer: Self-pay | Admitting: Family Medicine

## 2024-09-08 NOTE — Addendum Note (Signed)
 Addended by: Darcel Zick M on: 09/08/2024 05:26 PM   Modules accepted: Orders

## 2024-09-08 NOTE — Progress Notes (Signed)
 No blood work desired at time of visit. Apparently, when patient got to lab, he was offered HIV and RPR blood testing because the paper state lab form had been erroneously completed and sent along with other paper lab orders. At that time, patient accepted blood draw. [facility] staff sent blood off to state lab.   Results are in. I have now placed appropriate orders in this encounter so we can load results into his chart.   Dorothyann Helling, MD 09/08/24  5:26 PM

## 2024-09-08 NOTE — Telephone Encounter (Signed)
 Concerned identified for blood draw without clinic order. HIV and RPR results, resulted with no epic order on file. Spoke with patient, confirmed name and date of birth. Informed me that he initially declined bloodwork but when offered in lab, accepted.   Dr.Lynn to put in correct orders.   Ronnald FORBES Orange, CMA

## 2024-09-08 NOTE — Telephone Encounter (Signed)
 Patient returning call.

## 2024-09-20 NOTE — Addendum Note (Signed)
 Addended by: Caliah Kopke on: 09/20/2024 01:39 PM   Modules accepted: Orders
